# Patient Record
Sex: Male | Born: 1990 | Race: Black or African American | Hispanic: No | Marital: Single
Health system: Southern US, Community
[De-identification: ages and names within clinical notes are randomized; demographics above are authoritative.]

---
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
## 2011-11-14 IMAGING — RF DG UGI W/O KUB
10 series · 10 of 10 positions shown · non-contrast
Comparison: None.

CLINICAL DATA: Gastroesophageal reflux disease, esophagitis.

UPPER GI SERIES WITHOUT KUB
TECHNIQUE: Routine upper GI series was performed with thin barium.
Fluoroscopy Time: 0.9 minutes

[Series 1: run · 1 of 1 slices shown (1 of 10)]
[im 1/1]
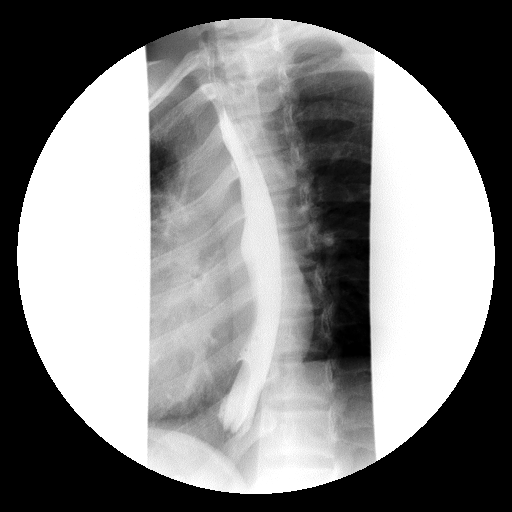

[Series 2: run · 1 of 1 slices shown (2 of 10)]
[im 1/1]
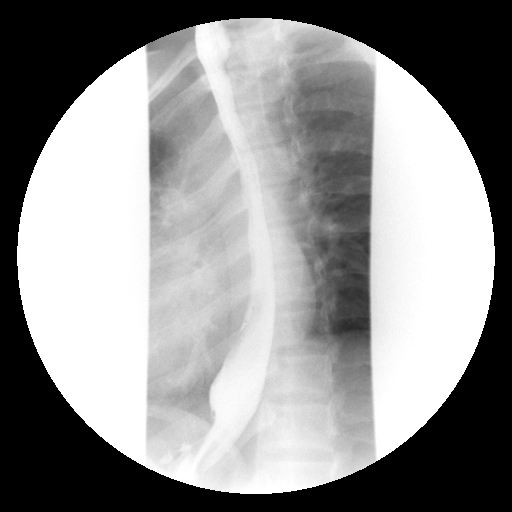

[Series 3: run · 1 of 1 slices shown (3 of 10)]
[im 1/1]
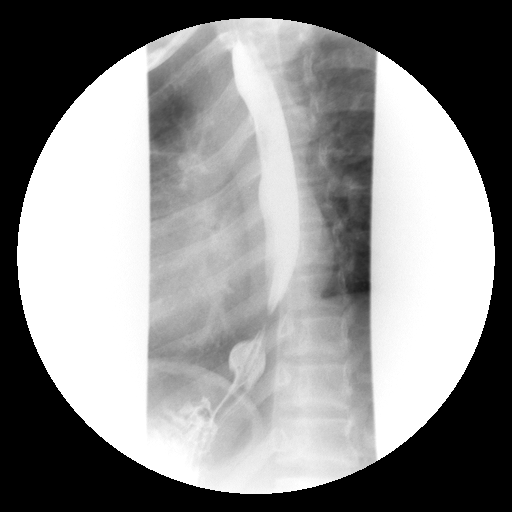

[Series 4: run · 1 of 1 slices shown (4 of 10)]
[im 1/1]
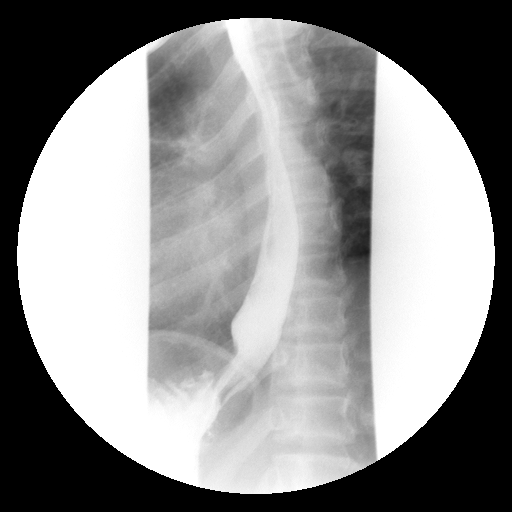

[Series 5: run · 1 of 1 slices shown (5 of 10)]
[im 1/1]
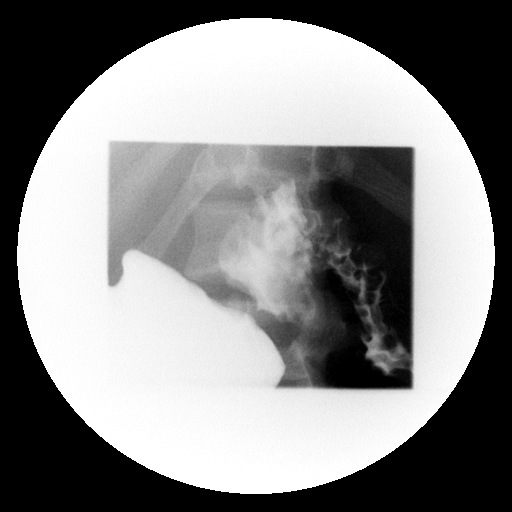

[Series 6: run · 1 of 1 slices shown (6 of 10)]
[im 1/1]
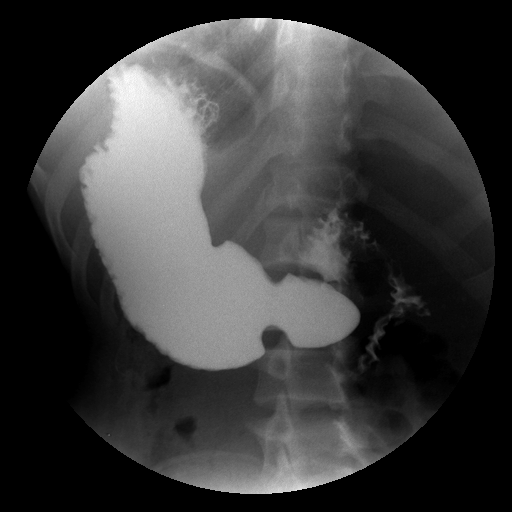

[Series 7: run · 1 of 1 slices shown (7 of 10)]
[im 1/1]
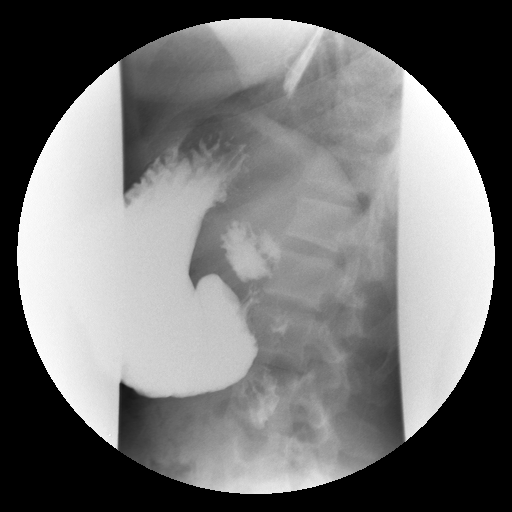

[Series 8: run · 1 of 1 slices shown (8 of 10)]
[im 1/1]
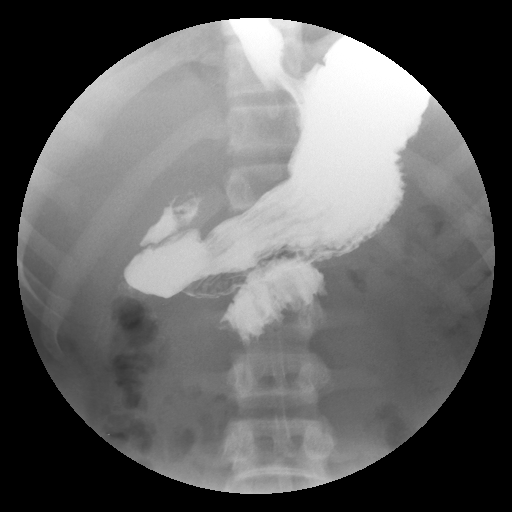

[Series 9: run · 1 of 1 slices shown (9 of 10)]
[im 1/1]
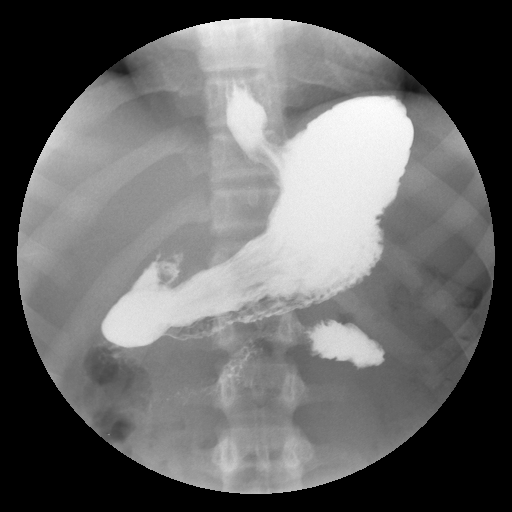

[Series 10: run · 1 of 1 slices shown (10 of 10)]
[im 1/1]
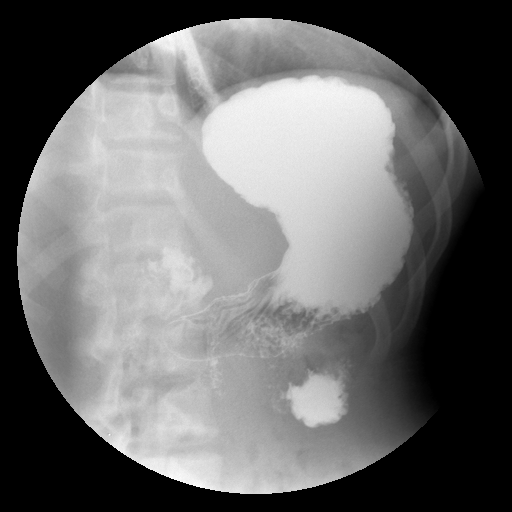

[10 of 10 positions shown; findings below may reference images not displayed]

FINDINGS: Esophageal motility is normal.  No esophageal fold
thickening, stricture or obstruction.  Small hiatal hernia.
Stomach and duodenal bulb are unremarkable.  The duodenal C-loop is
in its expected anatomic location.  Gastroesophageal reflux was
witnessed during the exam.
IMPRESSION: 1.  Gastroesophageal reflux.
2.  Small hiatal hernia.

## 2019-06-23 ENCOUNTER — Ambulatory Visit: Payer: Self-pay | Attending: Internal Medicine

## 2019-06-23 DIAGNOSIS — Z23 Encounter for immunization: Secondary | ICD-10-CM

## 2019-06-23 NOTE — Progress Notes (Signed)
   Cov[facility] Vaccination [facility]  Name:  Qasim Diveley    MRN: [id number] DOB: 1990/05/25  06/23/2019  Mr. Sermersheim was observed post Covid-19 immunization for 15 minutes without incident. He was provided with Vaccine Information Sheet and instruction to access the V-Safe system.   Mr. Lisenby was instructed to call 911 with any severe reactions post vaccine: Marland Kitchen Difficulty breathing  . Swelling of face and throat  . A fast heartbeat  . A bad rash all over body  . Dizziness and weakness   Immunizations Administered    Name Date Dose VIS Date Route   Pfizer COVID-19 Vaccine 06/23/2019  8:52 AM 0.3 mL 02/16/2019 Intramuscular   Manufacturer: [employer], [employer]   Lot: [id number]   NDC: [address]-1

## 2019-07-17 ENCOUNTER — Ambulatory Visit: Payer: Self-pay | Attending: Internal Medicine

## 2019-07-17 DIAGNOSIS — Z23 Encounter for immunization: Secondary | ICD-10-CM

## 2019-07-17 NOTE — Progress Notes (Signed)
   Covid-19 Vaccination Clinic  Name:  Joshuajames Moehring    MRN: [id number] DOB: 10/23/1990  07/17/2019  Mr. Feil was observed post Covid-19 immunization for 15 minutes without incident. He was provided with Vaccine Information Sheet and instruction to access the V-Safe system.   Mr. Kiper was instructed to call 911 with any severe reactions post vaccine: Marland Kitchen Difficulty breathing  . Swelling of face and throat  . A fast heartbeat  . A bad rash all over body  . Dizziness and weakness   Immunizations Administered    Name Date Dose VIS Date Route   Pfizer COVID-19 Vaccine 07/17/2019  8:56 AM 0.3 mL 05/02/2018 Intramuscular   Manufacturer: [employer], [employer]   Lot: [id number]   NDC: [address]-7
